# Patient Record
Sex: Male | Born: 1985 | Hispanic: No | Marital: Single | Smoking: Current every day smoker
Health system: Southern US, Community
[De-identification: ages and names within clinical notes are randomized; demographics above are authoritative.]

---
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
## 2014-12-16 ENCOUNTER — Emergency Department (HOSPITAL_COMMUNITY)
Admission: EM | Admit: 2014-12-16 | Discharge: 2014-12-16 | Disposition: A | Discharge status: Home / Self Care | Payer: Self-pay | Attending: Emergency Medicine | Admitting: Emergency Medicine

## 2014-12-16 ENCOUNTER — Encounter (HOSPITAL_COMMUNITY): Payer: Self-pay | Admitting: Emergency Medicine

## 2014-12-16 ENCOUNTER — Emergency Department (HOSPITAL_COMMUNITY): Payer: Self-pay

## 2014-12-16 DIAGNOSIS — K2100 Gastro-esophageal reflux disease with esophagitis, without bleeding: Secondary | ICD-10-CM

## 2014-12-16 DIAGNOSIS — Z720 Tobacco use: Secondary | ICD-10-CM | POA: Insufficient documentation

## 2014-12-16 LAB — BASIC METABOLIC PANEL
Anion gap: 6 (ref 5–15)
BUN: 10 mg/dL (ref 6–23)
CALCIUM: 9.3 mg/dL (ref 8.4–10.5)
CO2: 27 mmol/L (ref 19–32)
CREATININE: 0.94 mg/dL (ref 0.50–1.35)
Chloride: 105 mmol/L (ref 96–112)
GFR calc Af Amer: >90 mL/min (ref >90)
GLUCOSE: 96 mg/dL (ref 70–99)
Potassium: 4.3 mmol/L (ref 3.5–5.1)
Sodium: 138 mmol/L (ref 135–145)

## 2014-12-16 LAB — CBC
HEMATOCRIT: 44.7 % (ref 39.0–52.0)
Hemoglobin: 15.1 g/dL (ref 13.0–17.0)
MCH: 30 pg (ref 26.0–34.0)
MCHC: 33.8 g/dL (ref 30.0–36.0)
MCV: 88.9 fL (ref 78.0–100.0)
Platelets: 324 10*3/uL (ref 150–400)
RBC: 5.03 MIL/uL (ref 4.22–5.81)
RDW: 13.2 % (ref 11.5–15.5)
WBC: 16.6 10*3/uL — ABNORMAL HIGH (ref 4.0–10.5)

## 2014-12-16 LAB — I-STAT TROPONIN, ED: TROPONIN I, POC: 0 ng/mL (ref 0.00–0.08)

## 2014-12-16 MED ORDER — OMEPRAZOLE 20 MG PO CPDR: 20.0000 mg | DELAYED_RELEASE_CAPSULE | Freq: Every day | ORAL | Status: AC

## 2014-12-16 MED ORDER — PANTOPRAZOLE SODIUM 40 MG PO TBEC
40.0000 mg | DELAYED_RELEASE_TABLET | Freq: Every day | ORAL | Status: DC
  Administered 2014-12-16: 40 mg via ORAL
  Filled 2014-12-16: qty 1

## 2014-12-16 MED ORDER — GI COCKTAIL ~~LOC~~
30.0000 mL | Freq: Once | ORAL | Status: AC
  Administered 2014-12-16: 30 mL via ORAL
  Filled 2014-12-16: qty 30

## 2014-12-16 NOTE — ED Notes (Signed)
Pt states no relief w/ GI cocktail, to give Protonix tablet and discharge per EDP orders

## 2014-12-16 NOTE — ED Notes (Signed)
Pt c/o central chest pain x 2 days ago. Pt states that took an Scientific laboratory technicianByer ASA today and didn't help the pain.  Pt describes the pain as when you breathe in cold air or minty toothpaste it hurts but inside pt's chest.  Pt does smoke.

## 2014-12-16 NOTE — Discharge Instructions (Signed)
Esophagitis Esophagitis is inflammation of the esophagus. It can involve swelling, soreness, and pain in the esophagus. This condition can make it difficult and painful to swallow. CAUSES  Most causes of esophagitis are not serious. Many different factors can cause esophagitis, including:  Gastroesophageal reflux disease (GERD). This is when acid from your stomach flows up into the esophagus.  Recurrent vomiting.  An allergic-type reaction.  Certain medicines, especially those that come in large pills.  Ingestion of harmful chemicals, such as household cleaning products.  Heavy alcohol use.  An infection of the esophagus.  Radiation treatment for cancer.  Certain diseases such as sarcoidosis, Crohn's disease, and scleroderma. These diseases may cause recurrent esophagitis. SYMPTOMS   Trouble swallowing.  Painful swallowing.  Chest pain.  Difficulty breathing.  Nausea.  Vomiting.  Abdominal pain. DIAGNOSIS  Your caregiver will take your history and do a physical exam. Depending upon what your caregiver finds, certain tests may also be done, including:  Barium X-ray. You will drink a solution that coats the esophagus, and X-rays will be taken.  Endoscopy. A lighted tube is put down the esophagus so your caregiver can examine the area.  Allergy tests. These can sometimes be arranged through follow-up visits. TREATMENT  Treatment will depend on the cause of your esophagitis. In some cases, steroids or other medicines may be given to help relieve your symptoms or to treat the underlying cause of your condition. Medicines that may be recommended include:  Viscous lidocaine, to soothe the esophagus.  Antacids.  Acid reducers.  Proton pump inhibitors.  Antiviral medicines for certain viral infections of the esophagus.  Antifungal medicines for certain fungal infections of the esophagus.  Antibiotic medicines, depending on the cause of the esophagitis. HOME CARE  INSTRUCTIONS   Avoid foods and drinks that seem to make your symptoms worse.  Eat small, frequent meals instead of large meals.  Avoid eating for the 3 hours prior to your bedtime.  If you have trouble taking pills, use a pill splitter to decrease the size and likelihood of the pill getting stuck or injuring the esophagus on the way down. Drinking water after taking a pill also helps.  Stop smoking if you smoke.  Maintain a healthy weight.  Wear loose-fitting clothing. Do not wear anything tight around your waist that causes pressure on your stomach.  Raise the head of your bed 6 to 8 inches with wood blocks to help you sleep. Extra pillows will not help.  Only take over-the-counter or prescription medicines as directed by your caregiver. SEEK IMMEDIATE MEDICAL CARE IF:  You have severe chest pain that radiates into your arm, neck, or jaw.  You feel sweaty, dizzy, or lightheaded.  You have shortness of breath.  You vomit blood.  You have difficulty or pain with swallowing.  You have bloody or black, tarry stools.  You have a fever.  You have a burning sensation in the chest more than 3 times a week for more than 2 weeks.  You cannot swallow, drink, or eat.  You drool because you cannot swallow your saliva. MAKE SURE YOU:  Understand these instructions.  Will watch your condition.  Will get help right away if you are not doing well or get worse. Document Released: 10/21/2004 Document Revised: 12/06/2011 Document Reviewed: 05/14/2011 Exit[employer] Patient Information 2015 [employer]. This information is not intended to replace advice given to you by your health care provider. Make sure you discuss any questions you have with your health care provider.  

## 2014-12-16 NOTE — ED Provider Notes (Signed)
CSN: [id number]     Arrival date & time 12/16/14  1514 History   None    Chief Complaint  Patient presents with  . Chest Pain     (Consider location/radiation/quality/duration/timing/severity/associated sxs/prior Treatment) Patient is a 29 y.o. male presenting with chest pain. The history is provided by the patient. No language interpreter was used.  Chest Pain Pain location:  Substernal area Pain quality: aching   Pain radiates to:  Does not radiate Pain radiates to the back: no   Onset quality:  Gradual Duration:  2 days Timing:  Constant Progression:  Worsening Chronicity:  New Context: not breathing   Relieved by:  Nothing Worsened by:  Nothing tried Risk factors: diabetes mellitus and male sex   Risk factors: no high cholesterol, no hypertension, not obese, not pregnant and no smoking     History reviewed. No pertinent past medical history. History reviewed. No pertinent past surgical history. No family history on file. History  Substance Use Topics  . Smoking status: Current Every Day Smoker    Types: Cigarettes  . Smokeless tobacco: Not on file  . Alcohol Use: Not on file    Review of Systems  Cardiovascular: Positive for chest pain.  All other systems reviewed and are negative.     Allergies  Review of patient's allergies indicates not on file.  Home Medications   Prior to Admission medications   Not on File   BP 137/89 mmHg  Pulse 65  Temp(Src) 98.2 F (36.8 C)  Resp 16  Ht 5' 6.5" (1.689 m)  SpO2 100% Physical Exam  Constitutional: He is oriented to person, place, and time. He appears well-developed and well-nourished.  HENT:  Head: Normocephalic.  Right Ear: External ear normal.  Left Ear: External ear normal.  Nose: Nose normal.  Mouth/Throat: Oropharynx is clear and moist.  Eyes: Conjunctivae are normal. Pupils are equal, round, and reactive to light.  Neck: Normal range of motion. Neck supple.  Cardiovascular: Normal rate and normal  heart sounds.   Pulmonary/Chest: Effort normal and breath sounds normal.  Abdominal: Soft.  Musculoskeletal: Normal range of motion.  Neurological: He is alert and oriented to person, place, and time. He has normal reflexes.  Skin: Skin is warm.  Psychiatric: He has a normal mood and affect.  Vitals reviewed.   ED Course  Procedures (including critical care time) Labs Review Labs Reviewed  CBC - Abnormal; Notable for the following:    WBC 16.6 (*)    All other components within normal limits  BASIC METABOLIC PANEL  I-STAT TROPOININ, ED    Imaging Review Dg Chest 2 View  12/16/2014   CLINICAL DATA:  Central chest pain for 2 days  EXAM: CHEST  2 VIEW  COMPARISON:  None.  FINDINGS: The heart size and mediastinal contours are within normal limits. There is no focal infiltrate, pulmonary edema, or pleural effusion. The visualized skeletal structures are unremarkable.  IMPRESSION: No active cardiopulmonary disease.   Electronically Signed   By: Sherian Rein M.D.   On: 12/16/2014 16:23     EKG Interpretation None     Results for orders placed or performed during the hospital encounter of 12/16/14  CBC  Result Value Ref Range   WBC 16.6 (H) 4.0 - 10.5 K/uL   RBC 5.03 4.22 - 5.81 MIL/uL   Hemoglobin 15.1 13.0 - 17.0 g/dL   HCT 40.9 81.1 - 91.4 %   MCV 88.9 78.0 - 100.0 fL   MCH 30.0 26.0 -  34.0 pg   MCHC 33.8 30.0 - 36.0 g/dL   RDW 40.913.2 81.111.5 - 91.415.5 %   Platelets 324 150 - 400 K/uL  Basic metabolic panel  Result Value Ref Range   Sodium 138 135 - 145 mmol/L   Potassium 4.3 3.5 - 5.1 mmol/L   Chloride 105 96 - 112 mmol/L   CO2 27 19 - 32 mmol/L   Glucose, Bld 96 70 - 99 mg/dL   BUN 10 6 - 23 mg/dL   Creatinine, Ser 7.820.94 0.50 - 1.35 mg/dL   Calcium 9.3 8.4 - 95.610.5 mg/dL   GFR calc non Af Amer >90 >90 mL/min   GFR calc Af Amer >90 >90 mL/min   Anion gap 6 5 - 15  [facility] trop[facility])  Result Value Ref Range   Troponin i, poc 0.00 0.00 - 0.08 ng/mL   Comment 3            Dg Chest 2 View  12/16/2014   CLINICAL DATA:  Central chest pain for 2 days  EXAM: CHEST  2 VIEW  COMPARISON:  None.  FINDINGS: The heart size and mediastinal contours are within normal limits. There is no focal infiltrate, pulmonary edema, or pleural effusion. The visualized skeletal structures are unremarkable.  IMPRESSION: No active cardiopulmonary disease.   Electronically Signed   By: Sherian ReinWei-Chen  Lin M.D.   On: 12/16/2014 16:23    MDM   Final diagnoses:  None   Pt given gi cocktail with relief of symptoms Pt given rx for prilosec Pt advised to follow up with primary care.  Resource guide given    Elson AreasLeslie K Hajira Verhagen, PA-C 12/16/14 1948  Tilden FossaElizabeth Rees, MD 12/16/14 [phone]

## 2015-07-14 ENCOUNTER — Emergency Department (HOSPITAL_COMMUNITY)
Admission: EM | Admit: 2015-07-14 | Discharge: 2015-07-14 | Disposition: A | Discharge status: Home / Self Care | Payer: Self-pay | Attending: Emergency Medicine | Admitting: Emergency Medicine

## 2015-07-14 ENCOUNTER — Encounter (HOSPITAL_COMMUNITY): Payer: Self-pay | Admitting: Nurse Practitioner

## 2015-07-14 DIAGNOSIS — M7918 Myalgia, other site: Secondary | ICD-10-CM

## 2015-07-14 DIAGNOSIS — Y9389 Activity, other specified: Secondary | ICD-10-CM | POA: Insufficient documentation

## 2015-07-14 DIAGNOSIS — Z79899 Other long term (current) drug therapy: Secondary | ICD-10-CM | POA: Insufficient documentation

## 2015-07-14 DIAGNOSIS — Z720 Tobacco use: Secondary | ICD-10-CM | POA: Insufficient documentation

## 2015-07-14 DIAGNOSIS — S299XXA Unspecified injury of thorax, initial encounter: Secondary | ICD-10-CM | POA: Insufficient documentation

## 2015-07-14 DIAGNOSIS — S199XXA Unspecified injury of neck, initial encounter: Secondary | ICD-10-CM | POA: Insufficient documentation

## 2015-07-14 DIAGNOSIS — Z7982 Long term (current) use of aspirin: Secondary | ICD-10-CM | POA: Insufficient documentation

## 2015-07-14 DIAGNOSIS — Y998 Other external cause status: Secondary | ICD-10-CM | POA: Insufficient documentation

## 2015-07-14 DIAGNOSIS — Y92410 Unspecified street and highway as the place of occurrence of the external cause: Secondary | ICD-10-CM | POA: Insufficient documentation

## 2015-07-14 MED ORDER — IBUPROFEN 800 MG PO TABS
800.0000 mg | ORAL_TABLET | Freq: Three times a day (TID) | ORAL | Status: AC
Start: 2015-07-14 — End: ?

## 2015-07-14 MED ORDER — CYCLOBENZAPRINE HCL 10 MG PO TABS: 10.0000 mg | ORAL_TABLET | Freq: Two times a day (BID) | ORAL | Status: AC | PRN
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
## 2016-03-05 IMAGING — CR DG CHEST 2V
2 series · 2 of 2 positions shown · non-contrast
Comparison: None.

CLINICAL DATA: Central chest pain for 2 days

EXAM:
CHEST  2 VIEW

[w chest pa]
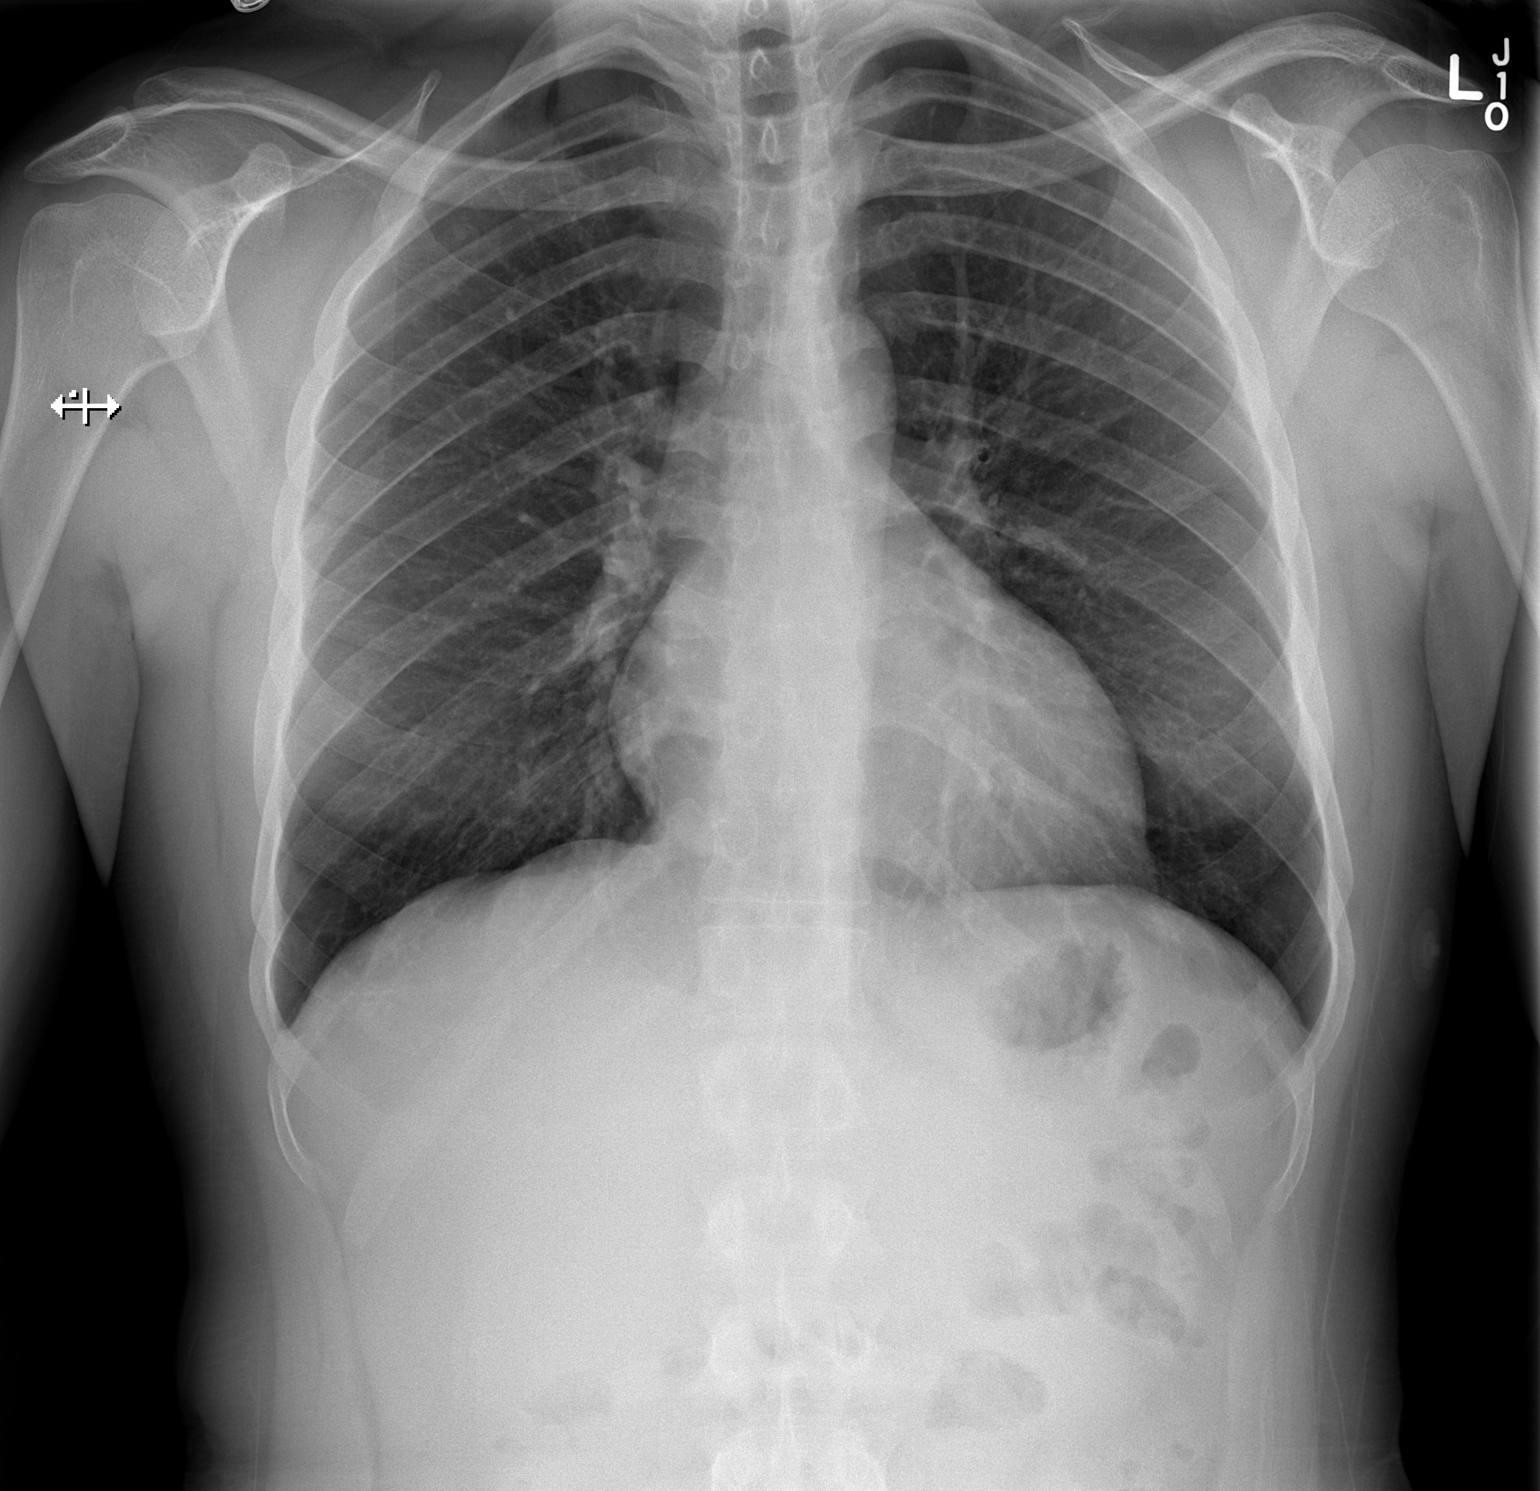

[w chest lat]
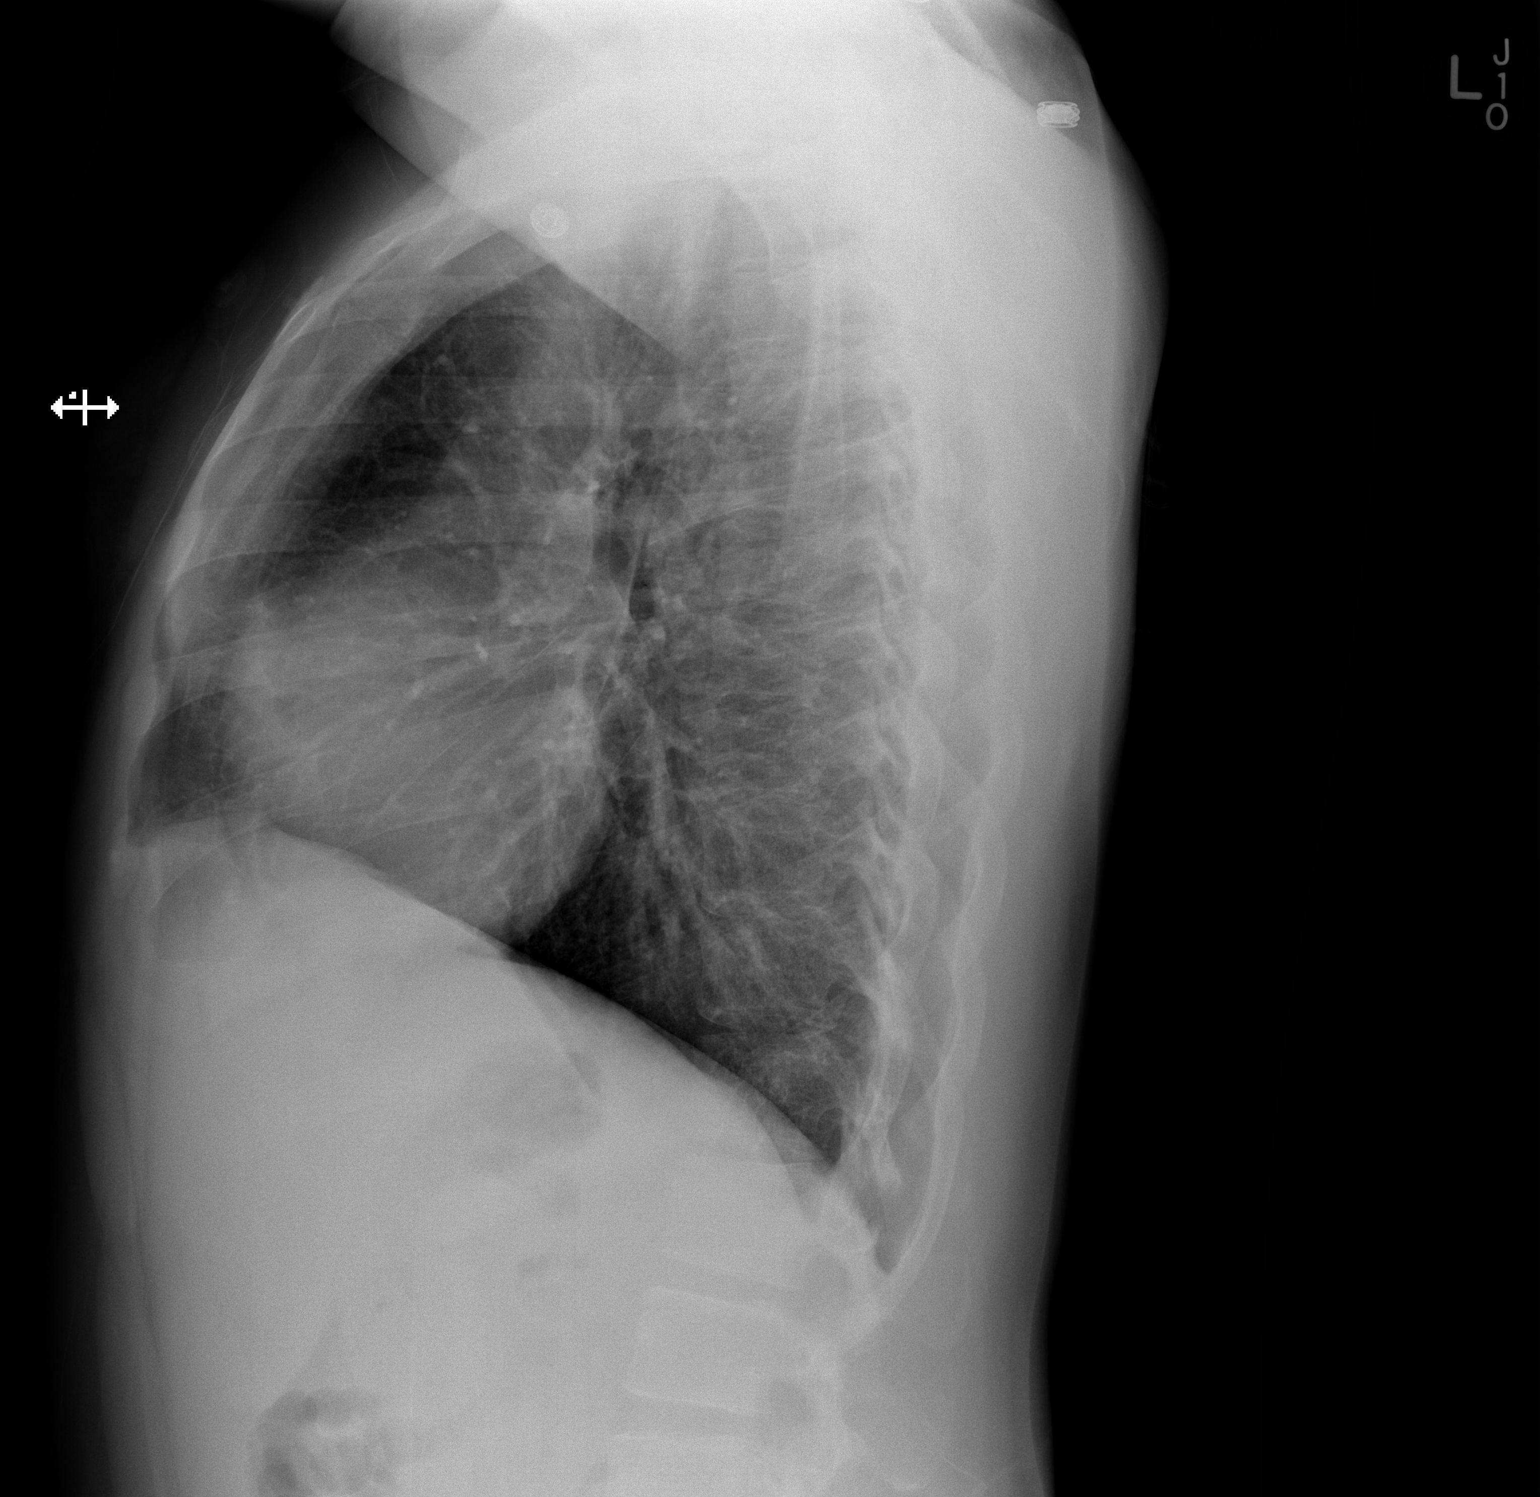

[2 of 2 positions shown; findings below may reference images not displayed]

FINDINGS: The heart size and mediastinal contours are within normal limits.
There is no focal infiltrate, pulmonary edema, or pleural effusion.
The visualized skeletal structures are unremarkable.
IMPRESSION: No active cardiopulmonary disease.

## 2018-04-01 ENCOUNTER — Encounter (HOSPITAL_COMMUNITY): Payer: Self-pay | Admitting: *Deleted

## 2018-04-01 ENCOUNTER — Other Ambulatory Visit: Payer: Self-pay

## 2018-04-01 ENCOUNTER — Emergency Department (HOSPITAL_COMMUNITY)
Admission: EM | Admit: 2018-04-01 | Discharge: 2018-04-02 | Disposition: A | Discharge status: Home / Self Care | Payer: Self-pay | Attending: Emergency Medicine | Admitting: Emergency Medicine

## 2018-04-01 DIAGNOSIS — F17210 Nicotine dependence, cigarettes, uncomplicated: Secondary | ICD-10-CM | POA: Insufficient documentation

## 2018-04-01 DIAGNOSIS — R519 Headache, unspecified: Secondary | ICD-10-CM

## 2018-04-01 DIAGNOSIS — R51 Headache: Secondary | ICD-10-CM | POA: Insufficient documentation

## 2018-04-01 DIAGNOSIS — Z79899 Other long term (current) drug therapy: Secondary | ICD-10-CM | POA: Insufficient documentation
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
## 2018-04-01 NOTE — ED Notes (Signed)
Bed: [facility] Expected date:  Expected time:  Means of arrival:  Comments: 

## 2018-04-02 MED ORDER — NAPROXEN 500 MG PO TABS: 500.0000 mg | ORAL_TABLET | Freq: Two times a day (BID) | ORAL | 0 refills | Status: AC

## 2018-04-02 MED ORDER — NAPROXEN 500 MG PO TABS
500.0000 mg | ORAL_TABLET | Freq: Once | ORAL | Status: AC
  Administered 2018-04-02: 500 mg via ORAL
  Filled 2018-04-02: qty 1

## 2018-04-02 NOTE — Discharge Instructions (Signed)
1. Medications: alternate tylenol and naprosyn, usual home medications 2. Treatment: rest, drink plenty of fluids,  3. Follow Up: Please followup with your primary doctor in 2-3 days for discussion of your diagnoses and further evaluation after today's visit; if you do not have a primary care doctor use the resource guide provided to find one; Please return to the ER for double vision, vomiting, numbness, tingling, loss of bowel or bladder control, gait disturbance or other concerns.
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
## 2024-04-19 ENCOUNTER — Ambulatory Visit (HOSPITAL_COMMUNITY)
Admission: EM | Admit: 2024-04-19 | Discharge: 2024-04-19 | Disposition: A | Discharge status: Home / Self Care | Attending: Family Medicine | Admitting: Family Medicine

## 2024-04-19 ENCOUNTER — Encounter (HOSPITAL_COMMUNITY): Payer: Self-pay

## 2024-04-19 DIAGNOSIS — J069 Acute upper respiratory infection, unspecified: Secondary | ICD-10-CM

## 2024-04-19 MED ORDER — AZELASTINE HCL 0.1 % NA SOLN: 1.0000 | Freq: Two times a day (BID) | NASAL | 1 refills | Status: AC

## 2024-04-19 MED ORDER — IBUPROFEN 200 MG PO TABS
ORAL_TABLET | ORAL | Status: AC
  Filled 2024-04-19: qty 3

## 2024-04-19 MED ORDER — IBUPROFEN 200 MG PO TABS
600.0000 mg | ORAL_TABLET | Freq: Once | ORAL | Status: AC
  Administered 2024-04-19: 600 mg via ORAL

## 2024-04-19 MED ORDER — PROMETHAZINE-DM 6.25-15 MG/5ML PO SYRP: 5.0000 mL | ORAL_SOLUTION | Freq: Four times a day (QID) | ORAL | 0 refills | Status: AC | PRN
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
## 2024-04-19 NOTE — Discharge Instructions (Addendum)
  1. Viral URI with cough (Primary) - azelastine  (ASTELIN ) 0.1 % nasal spray; Place 1 spray into both nostrils 2 (two) times daily. Use in each nostril as directed  Dispense: 30 mL; Refill: 1 - promethazine -dextromethorphan (PROMETHAZINE -DM) 6.25-15 MG/5ML syrup; Take 5 mLs by mouth 4 (four) times daily as needed for cough.  Dispense: 118 mL; Refill: 0 -Continue to monitor symptoms for any change in severity if there is any escalation of current symptoms or development of new symptoms follow-up in ER for further evaluation and management.

## 2024-04-19 NOTE — ED Triage Notes (Signed)
 Pt c/o cough, headache, body aches, and fatigue x2 days. States needs a work note.
